# Patient Record
Sex: Female | Born: 1990 | Race: Black or African American | Hispanic: No | Marital: Single | Smoking: Current every day smoker
Health system: Southern US, Community
[De-identification: ages and names within clinical notes are randomized; demographics above are authoritative.]

## PROBLEM LIST (undated history)

## (undated) DIAGNOSIS — I10 Essential (primary) hypertension: Secondary | ICD-10-CM

---
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
## 2010-04-16 LAB — URINALYSIS, ROUTINE W REFLEX MICROSCOPIC
Glucose, UA: NEGATIVE mg/dL
Ketones, ur: 40 mg/dL — AB
Protein, ur: NEGATIVE mg/dL
Urobilinogen, UA: 1 mg/dL (ref 0.0–1.0)

## 2010-04-16 LAB — URINE CULTURE: Colony Count: >=100000

## 2010-04-16 LAB — URINE MICROSCOPIC-ADD ON

## 2013-07-08 ENCOUNTER — Emergency Department (HOSPITAL_COMMUNITY): Payer: Self-pay

## 2013-07-08 ENCOUNTER — Encounter (HOSPITAL_COMMUNITY): Payer: Self-pay | Admitting: Emergency Medicine

## 2013-07-08 ENCOUNTER — Emergency Department (HOSPITAL_COMMUNITY)
Admission: EM | Admit: 2013-07-08 | Discharge: 2013-07-09 | Disposition: A | Discharge status: Home / Self Care | Payer: Self-pay | Attending: Emergency Medicine | Admitting: Emergency Medicine

## 2013-07-08 DIAGNOSIS — N730 Acute parametritis and pelvic cellulitis: Secondary | ICD-10-CM | POA: Insufficient documentation

## 2013-07-08 DIAGNOSIS — F17200 Nicotine dependence, unspecified, uncomplicated: Secondary | ICD-10-CM | POA: Insufficient documentation

## 2013-07-08 DIAGNOSIS — I10 Essential (primary) hypertension: Secondary | ICD-10-CM | POA: Insufficient documentation

## 2013-07-08 DIAGNOSIS — Z3202 Encounter for pregnancy test, result negative: Secondary | ICD-10-CM | POA: Insufficient documentation

## 2013-07-08 HISTORY — DX: Essential (primary) hypertension: I10

## 2013-07-08 LAB — URINALYSIS, ROUTINE W REFLEX MICROSCOPIC
Bilirubin Urine: NEGATIVE
Glucose, UA: NEGATIVE mg/dL
Hgb urine dipstick: NEGATIVE
Ketones, ur: NEGATIVE mg/dL
NITRITE: NEGATIVE
PROTEIN: NEGATIVE mg/dL
Specific Gravity, Urine: 1.023 (ref 1.005–1.030)
UROBILINOGEN UA: 1 mg/dL (ref 0.0–1.0)
pH: 6 (ref 5.0–8.0)

## 2013-07-08 LAB — POC URINE PREG, ED: Preg Test, Ur: NEGATIVE

## 2013-07-08 LAB — WET PREP, GENITAL
Clue Cells Wet Prep HPF POC: NONE SEEN
Trich, Wet Prep: NONE SEEN
Yeast Wet Prep HPF POC: NONE SEEN

## 2013-07-08 LAB — URINE MICROSCOPIC-ADD ON

## 2013-07-08 MED ORDER — CEFTRIAXONE SODIUM 1 G IJ SOLR
1.0000 g | Freq: Once | INTRAMUSCULAR | Status: AC
  Administered 2013-07-09: 1 g via INTRAMUSCULAR
  Filled 2013-07-08: qty 10

## 2013-07-08 MED ORDER — DOXYCYCLINE HYCLATE 100 MG PO CAPS: 100.0000 mg | ORAL_CAPSULE | Freq: Two times a day (BID) | ORAL | Status: DC

## 2013-07-08 MED ORDER — CEFTRIAXONE SODIUM 250 MG IJ SOLR: 250.0000 mg | Freq: Once | INTRAMUSCULAR | Status: DC

## 2013-07-08 MED ORDER — OXYCODONE-ACETAMINOPHEN 5-325 MG PO TABS
1.0000 | ORAL_TABLET | Freq: Once | ORAL | Status: AC
  Administered 2013-07-08: 1 via ORAL
  Filled 2013-07-08: qty 1

## 2013-07-08 MED ORDER — OXYCODONE-ACETAMINOPHEN 5-325 MG PO TABS: 1.0000 | ORAL_TABLET | ORAL | Status: DC | PRN

## 2013-07-08 MED ORDER — METRONIDAZOLE 500 MG PO TABS
2000.0000 mg | ORAL_TABLET | Freq: Once | ORAL | Status: AC
  Administered 2013-07-09: 2000 mg via ORAL
  Filled 2013-07-08: qty 4

## 2013-07-08 MED ORDER — AZITHROMYCIN 1 G PO PACK
1.0000 g | PACK | Freq: Once | ORAL | Status: AC
  Administered 2013-07-09: 1 g via ORAL
  Filled 2013-07-08: qty 1

## 2013-07-08 NOTE — ED Notes (Signed)
Radiology contacted [place], state [facility] should be here to see pt in 15-20 min.
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
## 2013-07-08 NOTE — ED Notes (Signed)
Per pt report: pt was recently contacted by the health dept and was told she was positive for chlamydia.  Pt is also having sever lower abd pain. Pt a/o x 4. Skin warm and dry. Pt ambulatory.

## 2013-07-08 NOTE — ED Provider Notes (Signed)
Care assumed from Bruce Donathony Allen, MD.    Titus DubinKortney Chenae Raj JanusRhone is a 23 y.o. female presents with vaginal discharge, lower abd cramping.  Pt reports she was contacted by the health department and told that she was positive for chlamydia.  Pt without N/V.  Wet prep with moderate WBCs.  Concern for PID.  Plan: Pt treated in the department with Flagyl, rocephin and azithromycin.  US pelvis pending.  Pt is to be d/c home with doxycycline.    Face to face Exam:   General: Awake  HEENT: Atraumatic  Resp: Normal effort  Abd: Nondistended, mild lower abdominal tenderness, no rebound  Neuro:No focal weakness  Lymph: No adenopathy  BP 137/89  Pulse 55  Temp(Src) 98 F (36.7 C)  Resp 20  SpO2 100%  LMP 06/29/2013    12:50 AM  Results for orders placed during the hospital encounter of 07/08/13  WET PREP, GENITAL      Result Value Ref Range   Yeast Wet Prep HPF POC NONE SEEN  NONE SEEN   Trich, Wet Prep NONE SEEN  NONE SEEN   Clue Cells Wet Prep HPF POC NONE SEEN  NONE SEEN   WBC, Wet Prep HPF POC MODERATE (*) NONE SEEN  URINALYSIS, ROUTINE W REFLEX MICROSCOPIC      Result Value Ref Range   Color, Urine YELLOW  YELLOW   APPearance CLEAR  CLEAR   Specific Gravity, Urine 1.023  1.005 - 1.030   pH 6.0  5.0 - 8.0   Glucose, UA NEGATIVE  NEGATIVE mg/dL   Hgb urine dipstick NEGATIVE  NEGATIVE   Bilirubin Urine NEGATIVE  NEGATIVE   Ketones, ur NEGATIVE  NEGATIVE mg/dL   Protein, ur NEGATIVE  NEGATIVE mg/dL   Urobilinogen, UA 1.0  0.0 - 1.0 mg/dL   Nitrite NEGATIVE  NEGATIVE   Leukocytes, UA SMALL (*) NEGATIVE  URINE MICROSCOPIC-ADD ON      Result Value Ref Range   Squamous Epithelial / LPF RARE  RARE   WBC, UA 0-2  <3 WBC/hpf  POC URINE PREG, ED      Result Value Ref Range   Preg Test, Ur NEGATIVE  NEGATIVE   [place] Transvaginal Non-ob  07/09/2013   CLINICAL DATA:  Pelvic pain. LMP 2 weeks ago. History of chlamydia.  EXAM: TRANSABDOMINAL AND TRANSVAGINAL ULTRASOUND OF PELVIS  DOPPLER  ULTRASOUND OF OVARIES  TECHNIQUE: Both transabdominal and transvaginal ultrasound examinations of the pelvis were performed. Transabdominal technique was performed for global imaging of the pelvis including uterus, ovaries, adnexal regions, and pelvic cul-de-sac.  It was necessary to proceed with endovaginal exam following the transabdominal exam to visualize the uterus and ovaries. Color and duplex Doppler ultrasound was utilized to evaluate blood flow to the ovaries.  COMPARISON:  None.  FINDINGS: Uterus  Measurements: 5.1 x 2.9 x 3.8 cm, anteverted. No fibroids or other mass visualized.  Endometrium  Thickness: 8 mm.  No focal abnormality visualized.  Right ovary  Measurements: 3.4 x 1.9 x 3.3 cm. Normal appearance/no adnexal mass.  Left ovary  Measurements: 2.8 x 1.7 x 1.8 cm . Normal appearance/no adnexal mass.  Pulsed Doppler evaluation of both ovaries demonstrates normal low-resistance arterial and venous waveforms. Flow is demonstrated within both ovaries on color flow Doppler imaging.  Other findings  No free fluid.  IMPRESSION: Normal ultrasound appearance of the uterus and ovaries. Normal Doppler flow and waveforms demonstrated in the ovaries.   Electronically Signed   By: Burman NievesWilliam  Stevens M.D.   On:  07/09/2013 00:06   US Pelvis Complete  07/09/2013   CLINICAL DATA:  Pelvic pain. LMP 2 weeks ago. History of chlamydia.  EXAM: TRANSABDOMINAL AND TRANSVAGINAL ULTRASOUND OF PELVIS  DOPPLER ULTRASOUND OF OVARIES  TECHNIQUE: Both transabdominal and transvaginal ultrasound examinations of the pelvis were performed. Transabdominal technique was performed for global imaging of the pelvis including uterus, ovaries, adnexal regions, and pelvic cul-de-sac.  It was necessary to proceed with endovaginal exam following the transabdominal exam to visualize the uterus and ovaries. Color and duplex Doppler ultrasound was utilized to evaluate blood flow to the ovaries.  COMPARISON:  None.  FINDINGS: Uterus  Measurements:  5.1 x 2.9 x 3.8 cm, anteverted. No fibroids or other mass visualized.  Endometrium  Thickness: 8 mm.  No focal abnormality visualized.  Right ovary  Measurements: 3.4 x 1.9 x 3.3 cm. Normal appearance/no adnexal mass.  Left ovary  Measurements: 2.8 x 1.7 x 1.8 cm . Normal appearance/no adnexal mass.  Pulsed Doppler evaluation of both ovaries demonstrates normal low-resistance arterial and venous waveforms. Flow is demonstrated within both ovaries on color flow Doppler imaging.  Other findings  No free fluid.  IMPRESSION: Normal ultrasound appearance of the uterus and ovaries. Normal Doppler flow and waveforms demonstrated in the ovaries.   Electronically Signed   By: Burman Nieves M.D.   On: 07/09/2013 00:06   [place] Art/ven Flow Abd Pelv Doppler  07/09/2013   CLINICAL DATA:  Pelvic pain. LMP 2 weeks ago. History of chlamydia.  EXAM: TRANSABDOMINAL AND TRANSVAGINAL ULTRASOUND OF PELVIS  DOPPLER ULTRASOUND OF OVARIES  TECHNIQUE: Both transabdominal and transvaginal ultrasound examinations of the pelvis were performed. Transabdominal technique was performed for global imaging of the pelvis including uterus, ovaries, adnexal regions, and pelvic cul-de-sac.  It was necessary to proceed with endovaginal exam following the transabdominal exam to visualize the uterus and ovaries. Color and duplex Doppler ultrasound was utilized to evaluate blood flow to the ovaries.  COMPARISON:  None.  FINDINGS: Uterus  Measurements: 5.1 x 2.9 x 3.8 cm, anteverted. No fibroids or other mass visualized.  Endometrium  Thickness: 8 mm.  No focal abnormality visualized.  Right ovary  Measurements: 3.4 x 1.9 x 3.3 cm. Normal appearance/no adnexal mass.  Left ovary  Measurements: 2.8 x 1.7 x 1.8 cm . Normal appearance/no adnexal mass.  Pulsed Doppler evaluation of both ovaries demonstrates normal low-resistance arterial and venous waveforms. Flow is demonstrated within both ovaries on color flow Doppler imaging.  Other findings  No free fluid.   IMPRESSION: Normal ultrasound appearance of the uterus and ovaries. Normal Doppler flow and waveforms demonstrated in the ovaries.   Electronically Signed   By: Burman Nieves M.D.   On: 07/09/2013 00:06    Ultrasound without evidence of tubo-ovarian abscess and normal Doppler flow.  This is been treated for PID here in the emergency department. Her vitals are stable she is nontoxic and nonseptic appearing. She'll be discharged home with doxycycline and Percocet.  I discussed all these findings with her. She's been informed to followup with OB/GYN within one week for further evaluation. She's to return to the emergency department for high fevers, intractable vomiting or worsening abdominal pain.  It has been determined that no acute conditions requiring further emergency intervention are present at this time. The patient/guardian have been advised of the diagnosis and plan. We have discussed signs and symptoms that warrant return to the ED, such as changes or worsening in symptoms.   Vital signs are stable at discharge.  BP 137/89  Pulse 55  Temp(Src) 98 F (36.7 C)  Resp 20  SpO2 100%  LMP 06/29/2013  Patient/guardian has voiced understanding and agreed to follow-up with the PCP or specialist.      Dierdre Forth, PA-C 07/09/13 [phone]

## 2013-07-08 NOTE — ED Notes (Signed)
[place] called, states they are at [facility] and will come see pt when they are done.

## 2013-07-09 LAB — RPR

## 2013-07-09 LAB — HIV ANTIBODY (ROUTINE TESTING W REFLEX): HIV 1&2 Ab, 4th Generation: NONREACTIVE

## 2013-07-09 LAB — GC/CHLAMYDIA PROBE AMP
CT PROBE, AMP APTIMA: POSITIVE — AB
GC Probe RNA: NEGATIVE

## 2013-07-09 NOTE — Discharge Instructions (Signed)
Pelvic Inflammatory Disease °Pelvic inflammatory disease (PID) refers to an infection in some or all of the female organs. The infection can be in the uterus, ovaries, fallopian tubes, or the surrounding tissues in the pelvis. PID can cause abdominal or pelvic pain that comes on suddenly (acute pelvic pain). PID is a serious infection because it can lead to lasting (chronic) pelvic pain or the inability to have children (infertile).  °CAUSES  °The infection is often caused by the normal bacteria found in the vaginal tissues. PID may also be caused by an infection that is spread during sexual contact. PID can also occur following:  °· The birth of a baby.   °· A miscarriage.   °· An abortion.   °· Major pelvic surgery.   °· The use of an intrauterine device (IUD).   °· A sexual assault.   °RISK FACTORS °Certain factors can put a person at higher risk for PID, such as: °· Being younger than 25 years. °· Being sexually active at a young age. °· Using nonbarrier contraception. °· Having multiple sexual partners. °· Having sex with someone who has symptoms of a genital infection. °· Using oral contraception. °Other times, certain behaviors can increase the possibility of getting PID, such as: °· Having sex during your period. °· Using a vaginal douche. °· Having an intrauterine device (IUD) in place. °SYMPTOMS  °· Abdominal or pelvic pain.   °· Fever.   °· Chills.   °· Abnormal vaginal discharge. °· Abnormal uterine bleeding.   °· Unusual pain shortly after finishing your period. °DIAGNOSIS  °Your caregiver will choose some of the following methods to make a diagnosis, such as:  °· Performing a physical exam and history. A pelvic exam typically reveals a very tender uterus and surrounding pelvis.   °· Ordering laboratory tests including a pregnancy test, blood tests, and urine test.  °· Ordering cultures of the vagina and cervix to check for a sexually transmitted infection (STI). °· Performing an ultrasound.    °· Performing a laparoscopic procedure to look inside the pelvis.   °TREATMENT  °· Antibiotic medicines may be prescribed and taken by mouth.   °· Sexual partners may be treated when the infection is caused by a sexually transmitted disease (STD).   °· Hospitalization may be needed to give antibiotics intravenously. °· Surgery may be needed, but this is rare. °It may take weeks until you are completely well. If you are diagnosed with PID, you should also be checked for human immunodeficiency virus (HIV).   °HOME CARE INSTRUCTIONS  °· If given, take your antibiotics as directed. Finish the medicine even if you start to feel better.   °· Only take over-the-counter or prescription medicines for pain, discomfort, or fever as directed by your caregiver.   °· Do not have sexual intercourse until treatment is completed or as directed by your caregiver. If PID is confirmed, your recent sexual partner(s) will need treatment.   °· Keep your follow-up appointments. °SEEK MEDICAL CARE IF:  °· You have increased or abnormal vaginal discharge.   °· You need prescription medicine for your pain.   °· You vomit.   °· You cannot take your medicines.   °· Your partner has an STD.   °SEEK IMMEDIATE MEDICAL CARE IF:  °· You have a fever.   °· You have increased abdominal or pelvic pain.   °· You have chills.   °· You have pain when you urinate.   °· You are not better after 72 hours following treatment.   °MAKE SURE YOU:  °· Understand these instructions. °· Will watch your condition. °· Will get help right away if you are not doing well or get worse. °  Document Released: 01/18/2005 Document Revised: 05/15/2012 Document Reviewed: 01/14/2011 [facility] Patient Information 2014 [place], [place].    Emergency Department Resource Guide 1) Find a Doctor and Pay Out of Pocket Although you won't have to find out who is covered by your insurance plan, it is a good idea to ask around and get recommendations. You will then need to call the  office and see if the doctor you have chosen will accept you as a new patient and what types of options they offer for patients who are self-pay. Some doctors offer discounts or will set up payment plans for their patients who do not have insurance, but you will need to ask so you aren't surprised when you get to your appointment.  2) Contact Your Local Health Department Not all health departments have doctors that can see patients for sick visits, but many do, so it is worth a call to see if yours does. If you don't know where your local health department is, you can check in your phone book. The CDC also has a tool to help you locate your state's health department, and many state websites also have listings of all of their local health departments.  3) Find a Walk-in Clinic If your illness is not likely to be very severe or complicated, you may want to try a walk in clinic. These are popping up all over the country in pharmacies, drugstores, and shopping centers. They're usually staffed by nurse practitioners or physician assistants that have been trained to treat common illnesses and complaints. They're usually fairly quick and inexpensive. However, if you have serious medical issues or chronic medical problems, these are probably not your best option.  No Primary Care Doctor: - Call Health Connect at  [phone] - they can help you locate a primary care doctor that  accepts your insurance, provides certain services, etc. - Physician Referral Service- [phone]  Chronic Pain Problems: Organization         Address  Phone   Notes  [facility]  [phone] Patients need to be referred by their primary care doctor.   Medication Assistance: Organization         Address  Phone   Notes  [facility] [address] [place], [place] [address] [phone]) [phone] --Must be a resident of [place] -- Must have NO insurance coverage  whatsoever (no Medicaid/ Medicare, etc.) -- The pt. MUST have a primary care doctor that directs their care regularly and follows them in the community   [facility]  [phone]   [employer]  [phone]    Agencies that provide inexpensive medical care: Organization         Address  Phone   Notes  [facility]  [phone]   [facility]    [phone]   [facility] [address] [place], [place] [address] [phone]   [facility] [address], [place] [phone]) [phone]   [facility]    [phone]   [facility]    [phone]   [facility]  [address], [place] Phone:  [phone]) [phone], Fax:  [phone] Hours of Operation:  9 am - 6 pm, M-F.  Also accepts Medicaid/Medicare and self-pay.  [facility]  [address], [place] Phone: [phone], Fax: [phone]. Hours of  8:30 am - 5:30 pm, M-F.  Also accepts Medicaid and self-pay.  °[facility] [place] [address], [place] Phone: [phone]   °[facility] [address], [place], [place] [phone], Ext. [phone] Mondays & Thursdays: 7-9 AM.  First 15 patients are seen on a first come, first serve basis. °  ° °Medicaid-accepting [place] Providers: ° °Organization         Address  Phone   Notes  °[facility] [address], [place] [phone] Also accepts self-pay patients.  °[facility] [address], [place] ° [phone]   °[facility] [address], [place] [phone]   °[facility] [address] [place] [address], [place] [phone]   °Veita Bland [address], [place]  ° [phone] Only accepts [place] Access Medicaid patients after they have their name applied  to their card.  ° °Self-Pay (no insurance) in [place]: ° °Organization         Address  Phone   Notes  °Sickle Cell Patients, [facility] [address], [place] [phone]   °[facility] [address], [place] [phone]   °[facility] ° [address], [place] [phone]   °[facility]/Dr. Osei-Bonsu ° [address] [place] [address], [place] or [address], [place] [phone] Phone number for both [place] and [place] locations is the same.  °[facility] [address], [place] [phone]   °[facility] [address] [place] [address], [place] or [address] [phone] °([phone]   °[facility] [address], [place] [phone], phone; [phone], fax Sees patients 1st and 3rd Saturday of every month.  Must not qualify for public or private insurance (i.e. Medicaid, Medicare, [place] Health Choice, Veterans' Benefits) • Household income should be no more than 200% of the poverty level •The clinic cannot treat you if you are pregnant or think you are pregnant • Sexually transmitted diseases are not treated at the clinic.  ° ° °Dental Care: °Organization         Address  Phone  Notes  °[place] [facility] [address], [place] [phone]) [phone] Accepts children up to age 21 who are enrolled in Medicaid or [place] Health Choice; pregnant women with a Medicaid card; and children who have applied for Medicaid or [place] Health Choice, but were declined, whose parents can pay a reduced fee at time of service.  °[place] [facility] [place]  [address], [place] [phone]) [phone] Accepts children up to age 21 who are enrolled in Medicaid or [place] Health Choice; pregnant women with a Medicaid card; and children who have applied for Medicaid or   Health Choice, but were declined, whose parents can pay a reduced fee at time of service.  °[facility] ° [address], [place] [phone]) [phone] Patients are seen by appointment only. Walk-ins are not accepted. [facility] will see patients 18 years of age and older. °Monday - Tuesday (8am-5pm) °Most Wednesdays (8:30-5pm) °$30 per visit, cash only  °[facility] ° [address], [place] [phone] Patients are seen by appointment   by appointment only. Walk-ins are not accepted. [facility] will see patients 53 years of age and older. One Wednesday Evening (Monthly: Volunteer Based).  $30 per visit, cash only  [employer]  [phone] for adults; Children under age 66, call Graduate Pediatric Dentistry at [phone]. Children aged 49-14, please call [phone] to request a pediatric application.  Dental services are provided in all areas of dental care including fillings, crowns and bridges, complete and partial dentures, implants, gum treatment, root canals, and extractions. Preventive care is also provided. Treatment is provided to both adults and children. Patients are selected via a lottery and there is often a waiting list.   [facility] [address] [place]  [phone] [email or web address]   [facility] [address] [place], [place] [phone], Ext. [phone] Second and Fourth Thursday of each month, opens at 6:30 AM; Clinic ends at 9 AM.  Patients are seen on a first-come first-served basis, and a limited number are seen during each clinic.   [facility]  [address] [place], [place] [phone]   Eligibility Requirements You must have lived in [place], [place], or [place] counties for at least the last three months.   You cannot be eligible for state or federal sponsored [place], including [employer], [place], or [employer].    You generally cannot be eligible for healthcare insurance through your employer.    How to apply: Eligibility screenings are held every Tuesday and Wednesday afternoon from 1:00 pm until 4:00 pm. You do not need an appointment for the interview!  [facility] [address], [place], [place] [phone]   [facility] Health Department  [phone]   [facility] Health Department  [phone]   [facility] Health Department  [phone]    Behavioral Health Resources in the Community: Intensive Outpatient Programs Organization         Address  Phone  Notes  [facility] [address], [place], [place] [phone]   [facility] Outpatient [address] [place], [place] [phone]   [facility] [address] [place], [place]  [phone]   [facility] [address]  [place], [place] [phone] or [phone]   Substance Abuse Resources Organization         Address  Phone  Notes  [facility]  [phone]   [facility]  ([phone]   [facility] [place]  [phone]   Floydene Flock  [phone]   Residential & Outpatient Substance Abuse Program  [phone]   Psychological Services Organization         Address  Phone  Notes  [facility] Behavioral Health  [phone]   [facility] Services  [phone]   [facility] [address], [place] [phone] or [phone]    Mobile Crisis Teams Organization         Address  Phone  Notes  [facility], Mobile Crisis Care Unit  7264258222   [facility]  [address] [place], [place] [phone]   Doristine Locks [address] [place] [phone]    Self-Help/Support Groups Organization         Address  Phone             Notes  [facility] [place] - variety of support groups  336- I7437963 Call  for more  information  Narcotics Anonymous (NA), [facility] [address], [employer] [place]  2 meetings at this location   Residential Sports administrator         Address  Phone  Notes  [facility] [address],    [place]  [phone]   [facility]  [address], [place] [address], [place], [place] [phone]   [facility] [address] [place], [place] [phone] Admissions: 8am-3pm M-F  [facility] [address],    [place], [place] [phone]   [facility] [address] [place], [place], [place] [phone]   [facility] [address]  [place], [place] [phone]   [facility] - Intensive Outpatient [address], [place], [place] [phone]   [facility] ([facility] [address]  [place], [place] [phone] or [phone]   [facility] ([facility]) [address], [place], [place] [phone] Accepts Medicaid  [facility] [place] [address]  [place] [phone] Substance Abuse/Addiction Treatment   [facility] Organization         Address  Phone  Notes  [facility]  [phone]   Angie Fava, PhD [address] [place], [place]   [phone] or [phone]   [facility]   [address] [place], [place] [phone]   [facility] [address], [place], [place] [phone] Insurance/Medicaid/sponsorship through [facility] and Families [address]                                    [place], [place] [phone] Therapy/tele-psych/case  [facility] [address] [place], [place] [phone]    Dr. Lolly Mustache  [phone]   [facility] [place]  [employer] [facility] Dept. 1) [address], [place] 2) [address] [place] 3)  [address], [place]  [phone]  [phone]   [facility] Child Abuse Hotline 717-566-3315 or [phone] (After Hours)

## 2013-07-09 NOTE — ED Provider Notes (Signed)
Medical screening examination/treatment/procedure(s) were performed by non-physician practitioner and as supervising physician I was immediately available for consultation/collaboration.  Yoshua Geisinger T Shylyn Younce, MD 07/09/13 0957 

## 2013-07-10 ENCOUNTER — Telehealth (HOSPITAL_BASED_OUTPATIENT_CLINIC_OR_DEPARTMENT_OTHER): Payer: Self-pay | Admitting: Emergency Medicine

## 2013-07-19 NOTE — Telephone Encounter (Signed)
Unable to contact patient via phone. Sent letter. °

## 2013-07-31 NOTE — Telephone Encounter (Signed)
Letter returned. Unable to contact patient. 

## 2014-12-31 IMAGING — US US PELVIS COMPLETE
1 series · 13 of 25 positions shown · non-contrast
Comparison: None.

CLINICAL DATA: Pelvic pain. LMP 2 weeks ago. History of chlamydia.

EXAM:
TRANSABDOMINAL AND TRANSVAGINAL ULTRASOUND OF PELVIS
DOPPLER ULTRASOUND OF OVARIES
TECHNIQUE: Both transabdominal and transvaginal ultrasound examinations of the
pelvis were performed. Transabdominal technique was performed for
global imaging of the pelvis including uterus, ovaries, adnexal
regions, and pelvic cul-de-sac.
It was necessary to proceed with endovaginal exam following the
transabdominal exam to visualize the uterus and ovaries. Color and
duplex Doppler ultrasound was utilized to evaluate blood flow to the
ovaries.

[Series 1: us pelvis complete · 0.20mm/px · 13 of 51 slices shown]
[im 1/51]
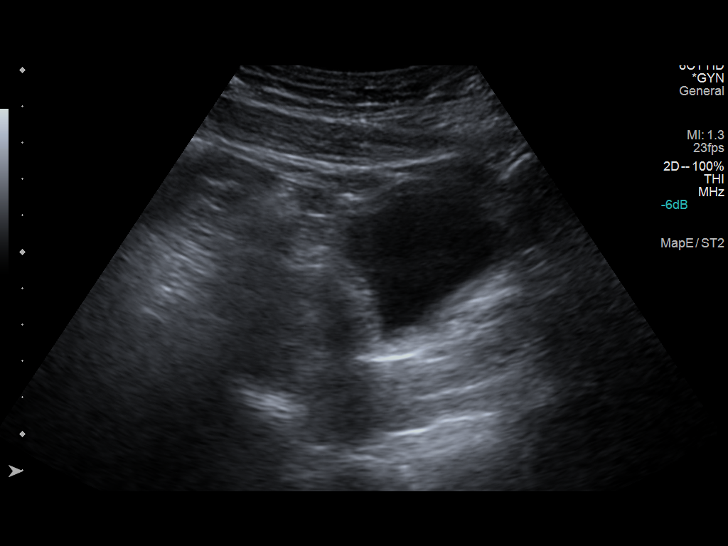
[im 5/51]
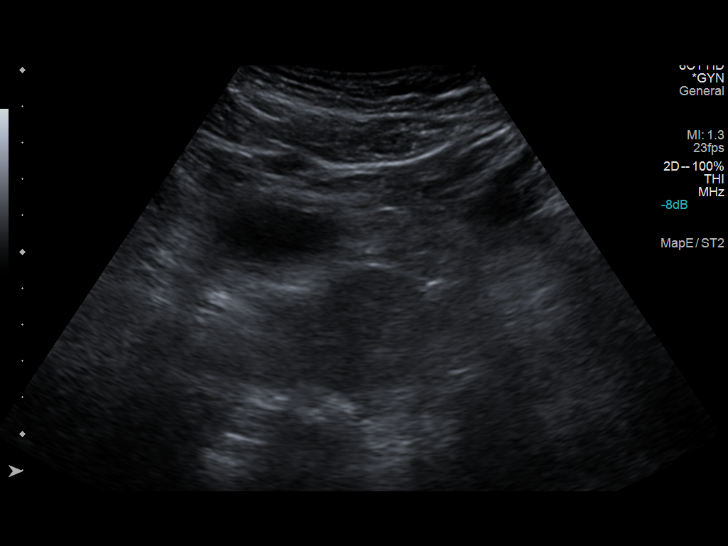
[im 9/51]
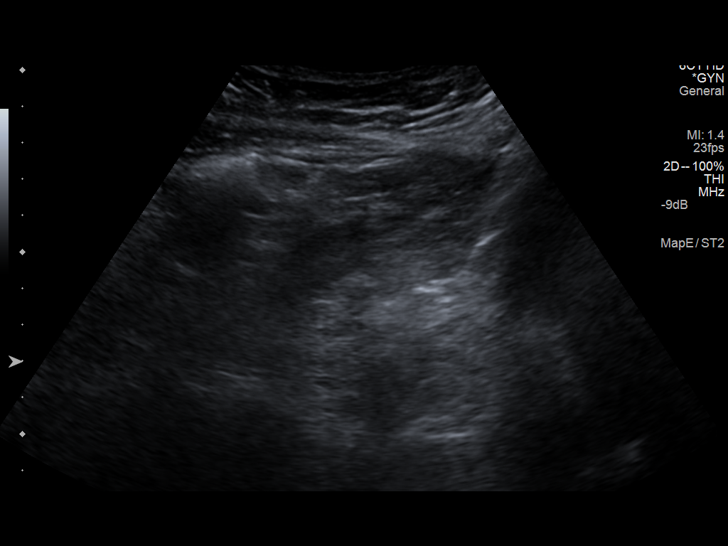
[im 13/51]
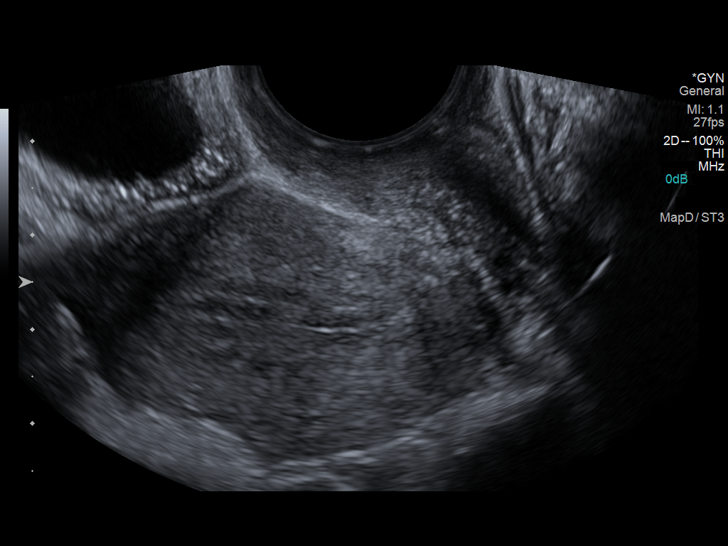
[im 17/51]
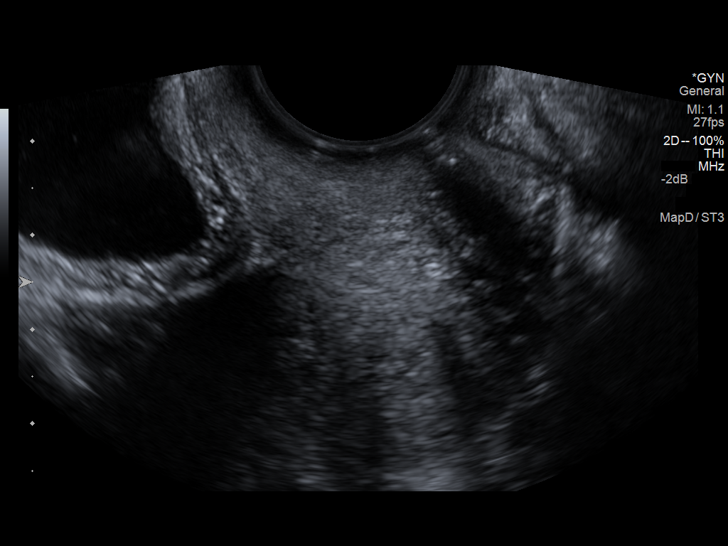
[im 21/51]
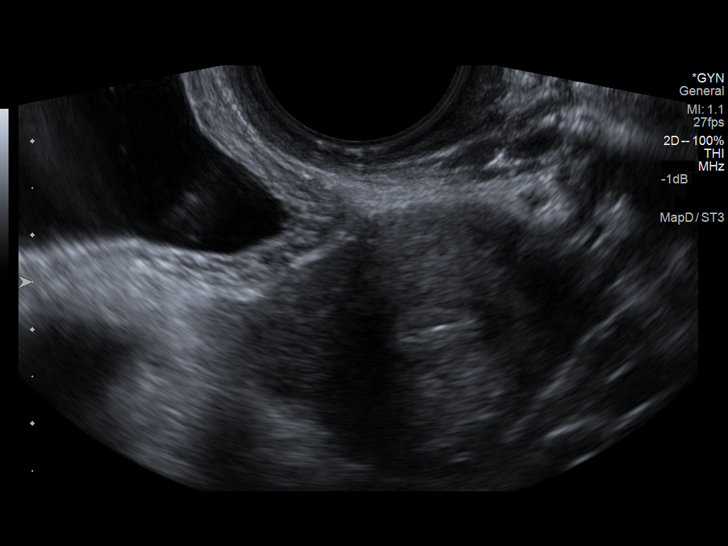
[im 26/51]
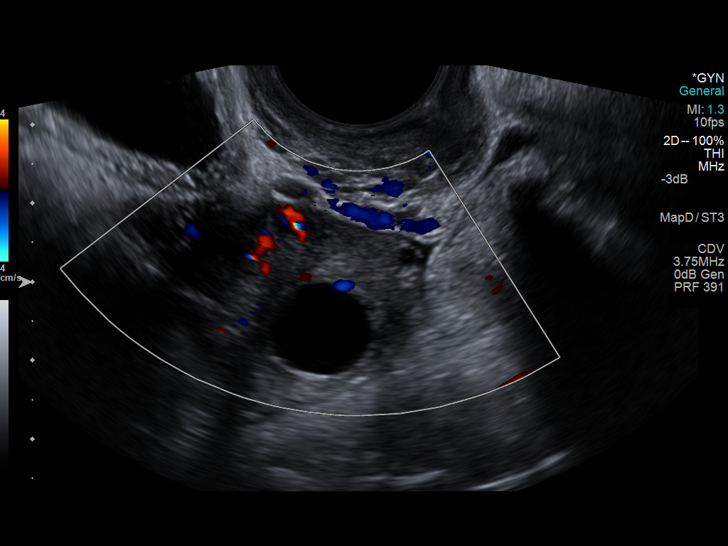
[im 30/51]
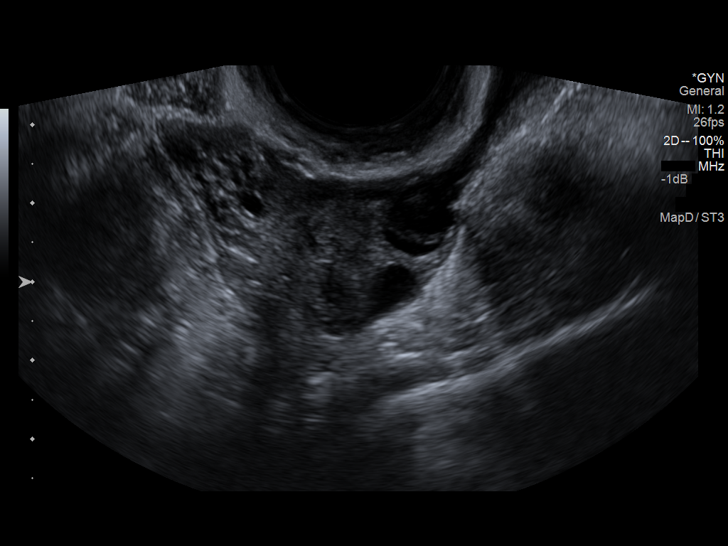
[im 34/51]
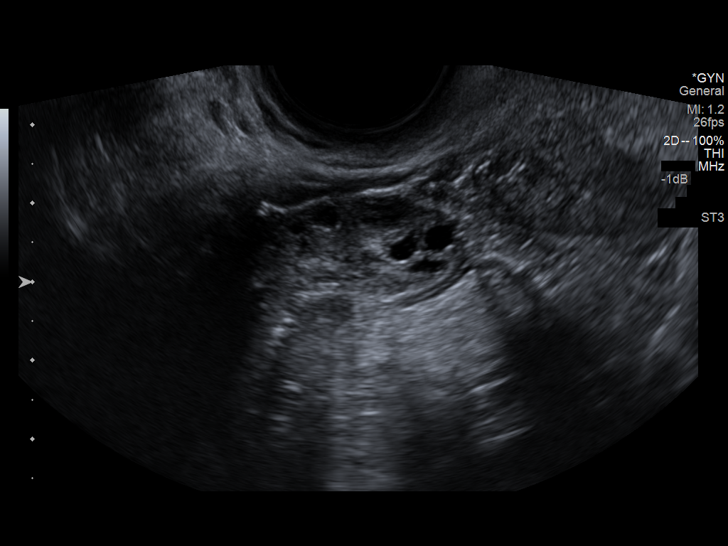
[im 38/51]
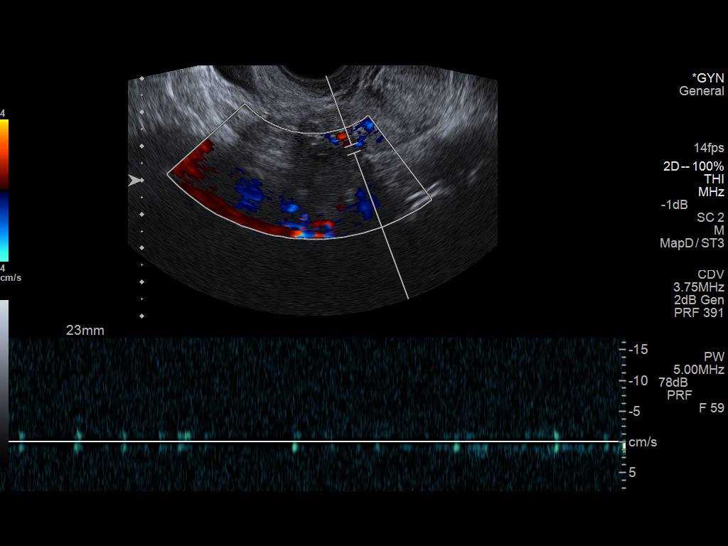
[im 42/51]
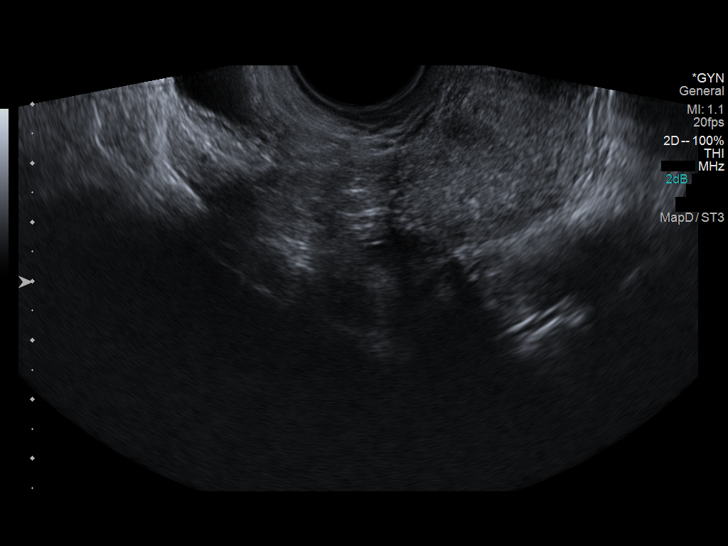
[im 46/51]
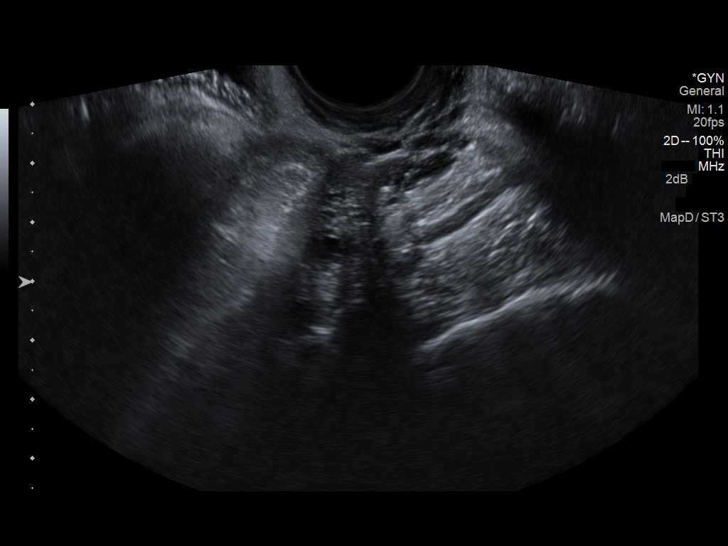
[im 51/51]
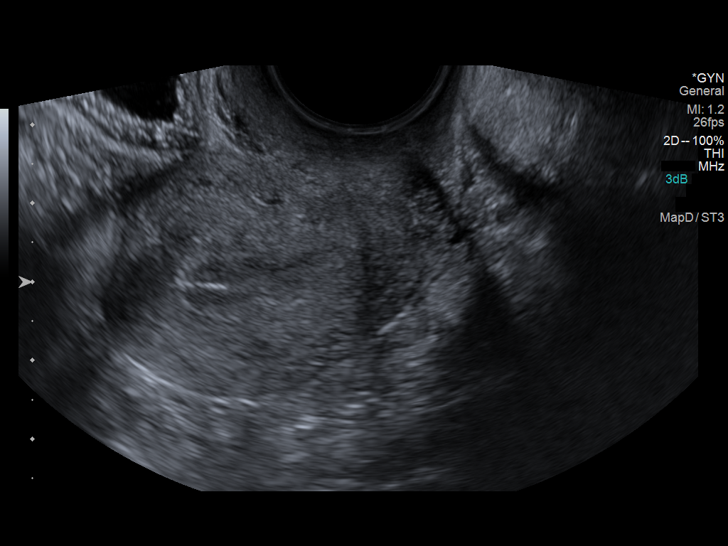

[13 of 25 positions shown; findings below may reference images not displayed]

FINDINGS: Uterus

Measurements: 5.1 x 2.9 x 3.8 cm, anteverted. No fibroids or other
mass visualized.

Endometrium

Thickness: 8 mm.  No focal abnormality visualized.

Right ovary

Measurements: 3.4 x 1.9 x 3.3 cm. Normal appearance/no adnexal mass.

Left ovary

Measurements: 2.8 x 1.7 x 1.8 cm . Normal appearance/no adnexal
mass.

Pulsed Doppler evaluation of both ovaries demonstrates normal
low-resistance arterial and venous waveforms. Flow is demonstrated
within both ovaries on color flow Doppler imaging.

Other findings

No free fluid.
IMPRESSION: Normal ultrasound appearance of the uterus and ovaries. Normal
Doppler flow and waveforms demonstrated in the ovaries.

## 2015-10-17 ENCOUNTER — Ambulatory Visit (HOSPITAL_COMMUNITY)
Admission: EM | Admit: 2015-10-17 | Discharge: 2015-10-17 | Disposition: A | Discharge status: Home / Self Care | Payer: Managed Care, Other (non HMO) | Attending: Family Medicine | Admitting: Family Medicine

## 2015-10-17 ENCOUNTER — Encounter (HOSPITAL_COMMUNITY): Payer: Self-pay | Admitting: Nurse Practitioner

## 2015-10-17 DIAGNOSIS — J0100 Acute maxillary sinusitis, unspecified: Secondary | ICD-10-CM | POA: Diagnosis not present

## 2015-10-17 MED ORDER — AMOXICILLIN-POT CLAVULANATE 875-125 MG PO TABS: 1.0000 | ORAL_TABLET | Freq: Two times a day (BID) | ORAL | 0 refills | Status: DC

## 2015-10-17 MED ORDER — ALBUTEROL SULFATE HFA 108 (90 BASE) MCG/ACT IN AERS: 2.0000 | INHALATION_SPRAY | Freq: Four times a day (QID) | RESPIRATORY_TRACT | 0 refills | Status: DC | PRN

## 2015-10-17 NOTE — ED Triage Notes (Signed)
She c/o 3 day history cough, congestion, rhinorrhea. She also c/o vaginal discharge and itching after each menstrual cycle for past 2 years. She has seen her doctor for this but they did not give her any diagnosis or treatment. She has tried monistat with no relief.

## 2015-10-17 NOTE — Discharge Instructions (Signed)
Start Augmentin twice a day as directed. Use Albuterol inhaler 2 puffs every 6 hours as needed for cough. Increase fluid intake and slowly advance diet as directed. Follow-up with a primary care provider in 3 to 4 days if not improving.

## 2015-10-17 NOTE — ED Provider Notes (Signed)
CSN: [id number]     Arrival date & time 10/17/15  1534 History   First MD Initiated Contact with Patient 10/17/15 1739     Chief Complaint  Patient presents with  . URI   (Consider location/radiation/quality/duration/timing/severity/associated sxs/prior Treatment) 25 year old female presents with nasal congestion, sinus pressure, coughing for the past 3-4 days. Started with fever, nausea, vomiting and diarrhea and now more sinus symptoms. Coughed up blood in her mucus which caused her concern. She has not taken any medication for symptoms.  Also complaining of vaginal discharge with odor and itching after each menstrual cycle for the past 2 years. Her GYN has not done any additional testing or provided treatment. She has tried OTC Monistat with minimal relief.    The history is provided by the patient.    Past Medical History:  Diagnosis Date  . Hypertension    History reviewed. No pertinent surgical history. History reviewed. No pertinent family history. Social History  Substance Use Topics  . Smoking status: Current Every Day Smoker    Packs/day: 0.25    Types: Cigarettes  . Smokeless tobacco: Never Used  . Alcohol use Yes     Comment: occasional   OB History    No data available     Review of Systems  Constitutional: Positive for chills, fatigue and fever.  HENT: Positive for congestion and sinus pressure. Negative for ear pain and sore throat.   Eyes: Negative for visual disturbance.  Respiratory: Positive for cough and wheezing. Negative for chest tightness.   Cardiovascular: Negative for chest pain.  Gastrointestinal: Positive for diarrhea, nausea and vomiting. Negative for blood in stool.  Genitourinary: Positive for vaginal discharge. Negative for dysuria.  Skin: Negative for rash.  Neurological: Positive for headaches. Negative for dizziness, weakness and numbness.  Hematological: Negative for adenopathy.    Allergies  Review of patient's allergies indicates  no known allergies.  Home Medications   Prior to Admission medications   Medication Sig Start Date End Date Taking? Authorizing Provider  albuterol (PROVENTIL HFA;VENTOLIN HFA) 108 (90 Base) MCG/ACT inhaler Inhale 2 puffs into the lungs every 6 (six) hours as needed for wheezing or shortness of breath. 10/17/15   Sudie Grumbling, NP  amoxicillin-clavulanate (AUGMENTIN) 875-125 MG tablet Take 1 tablet by mouth every 12 (twelve) hours. 10/17/15   Sudie Grumbling, NP   Meds Ordered and Administered this Visit  Medications - No data to display  BP 148/85 (BP Location: Left Arm)   Pulse 62   Temp 98.5 F (36.9 C) (Oral)   Resp 16   LMP 09/22/2015   SpO2 100%  No data found.   Physical Exam  Constitutional: She is oriented to person, place, and time. She appears well-developed and well-nourished. She appears ill. No distress.  HENT:  Head: Normocephalic and atraumatic.  Right Ear: Hearing, tympanic membrane, external ear and ear canal normal.  Left Ear: Hearing, external ear and ear canal normal. Tympanic membrane is bulging. A middle ear effusion is present.  Nose: Rhinorrhea present. Right sinus exhibits maxillary sinus tenderness. Right sinus exhibits no frontal sinus tenderness. Left sinus exhibits maxillary sinus tenderness. Left sinus exhibits no frontal sinus tenderness.  Mouth/Throat: Uvula is midline and mucous membranes are normal. Posterior oropharyngeal erythema (and post nasal drainage) present.  Neck: Normal range of motion. Neck supple.  Cardiovascular: Normal rate, regular rhythm and normal heart sounds.   Pulmonary/Chest: Effort normal. No respiratory distress. She has decreased breath sounds in the right upper  field, the right lower field, the left upper field and the left lower field. She has no wheezes. She has no rhonchi.  Lymphadenopathy:    She has no cervical adenopathy.  Neurological: She is alert and oriented to person, place, and time.  Skin: Skin is warm and  dry. Capillary refill takes less than 2 seconds.  Psychiatric: She has a normal mood and affect. Her behavior is normal. Judgment and thought content normal.    Urgent Care Course   Clinical Course    Procedures (including critical care time)  Labs Review Labs Reviewed - No data to display  Imaging Review No results found.   Visual Acuity Review  Right Eye Distance:   Left Eye Distance:   Bilateral Distance:    Right Eye Near:   Left Eye Near:    Bilateral Near:         MDM   1. Acute maxillary sinusitis, recurrence not specified   2. Cough  Recommend Augmentin 875mg  twice a day as directed. Use Albuterol inhaler 2 puffs every 6 hours as needed for wheezing and chest tightness. May take OTC Delsym as needed for cough.  Encouraged to stop smoking.  Discussed that since patient is ill today, she should return next week or go to her GYN for further evaluation of vaginal concerns  Follow-up as planned.     Sudie GrumblingAnn Berry Claretha Townshend, NP 10/18/15 [phone]

## 2016-11-09 DIAGNOSIS — N898 Other specified noninflammatory disorders of vagina: Secondary | ICD-10-CM | POA: Diagnosis not present

## 2016-11-09 DIAGNOSIS — Z6841 Body Mass Index (BMI) 40.0 and over, adult: Secondary | ICD-10-CM | POA: Diagnosis not present

## 2016-11-09 DIAGNOSIS — N941 Unspecified dyspareunia: Secondary | ICD-10-CM | POA: Diagnosis not present

## 2016-11-09 DIAGNOSIS — N944 Primary dysmenorrhea: Secondary | ICD-10-CM | POA: Diagnosis not present

## 2016-11-12 DIAGNOSIS — M79671 Pain in right foot: Secondary | ICD-10-CM | POA: Diagnosis not present

## 2016-11-12 DIAGNOSIS — I10 Essential (primary) hypertension: Secondary | ICD-10-CM | POA: Diagnosis not present

## 2016-11-12 DIAGNOSIS — F17210 Nicotine dependence, cigarettes, uncomplicated: Secondary | ICD-10-CM | POA: Diagnosis not present

## 2016-11-12 DIAGNOSIS — M79672 Pain in left foot: Secondary | ICD-10-CM | POA: Diagnosis not present

## 2016-11-19 DIAGNOSIS — Z1159 Encounter for screening for other viral diseases: Secondary | ICD-10-CM | POA: Diagnosis not present

## 2016-11-19 DIAGNOSIS — Z3169 Encounter for other general counseling and advice on procreation: Secondary | ICD-10-CM | POA: Diagnosis not present

## 2016-11-19 DIAGNOSIS — M79672 Pain in left foot: Secondary | ICD-10-CM | POA: Diagnosis not present

## 2016-11-19 DIAGNOSIS — Z114 Encounter for screening for human immunodeficiency virus [HIV]: Secondary | ICD-10-CM | POA: Diagnosis not present

## 2016-11-19 DIAGNOSIS — Z6841 Body Mass Index (BMI) 40.0 and over, adult: Secondary | ICD-10-CM | POA: Diagnosis not present

## 2016-11-19 DIAGNOSIS — I10 Essential (primary) hypertension: Secondary | ICD-10-CM | POA: Diagnosis not present

## 2016-11-19 DIAGNOSIS — Z01419 Encounter for gynecological examination (general) (routine) without abnormal findings: Secondary | ICD-10-CM | POA: Diagnosis not present

## 2016-11-19 DIAGNOSIS — M79671 Pain in right foot: Secondary | ICD-10-CM | POA: Diagnosis not present

## 2016-11-19 DIAGNOSIS — Z118 Encounter for screening for other infectious and parasitic diseases: Secondary | ICD-10-CM | POA: Diagnosis not present

## 2016-11-19 DIAGNOSIS — Z113 Encounter for screening for infections with a predominantly sexual mode of transmission: Secondary | ICD-10-CM | POA: Diagnosis not present

## 2016-12-17 ENCOUNTER — Other Ambulatory Visit: Payer: Self-pay | Admitting: Podiatry

## 2016-12-17 ENCOUNTER — Encounter: Payer: Self-pay | Admitting: Podiatry

## 2016-12-17 ENCOUNTER — Ambulatory Visit (INDEPENDENT_AMBULATORY_CARE_PROVIDER_SITE_OTHER): Payer: BLUE CROSS/BLUE SHIELD

## 2016-12-17 ENCOUNTER — Ambulatory Visit: Payer: BLUE CROSS/BLUE SHIELD | Admitting: Podiatry

## 2016-12-17 VITALS — BP 132/78 | HR 92 | Resp 16

## 2016-12-17 DIAGNOSIS — M659 Synovitis and tenosynovitis, unspecified: Secondary | ICD-10-CM | POA: Diagnosis not present

## 2016-12-17 DIAGNOSIS — M722 Plantar fascial fibromatosis: Secondary | ICD-10-CM

## 2016-12-17 MED ORDER — MELOXICAM 15 MG PO TABS: 15.0000 mg | ORAL_TABLET | Freq: Every day | ORAL | 0 refills | Status: DC

## 2016-12-17 NOTE — Patient Instructions (Addendum)

## 2016-12-17 NOTE — Progress Notes (Signed)
  Subjective:  Patient ID: Pam Campbell, female    DOB: 09/24/1990,  MRN: [id number]  Chief Complaint  Patient presents with  . Foot Pain    Plantar heel bilateral (R>L) - aching and discoloration x 11 months, AM pain, limited shoe gear, unable to exercise, tried vinegar soaks and OTC pain meds-no help    26 y.o. female presents with the above complaint.  Reports pain and aching for 11 months.  Worse in the a.m.  Has tried soaking without relief.  Also concerned about discoloration to the plantar aspect of her feet.  Past Medical History:  Diagnosis Date  . Hypertension    No past surgical history on file.  Current Outpatient Medications:  .  hydrochlorothiazide (HYDRODIURIL) 25 MG tablet, Take 25 mg daily by mouth., Disp: , Rfl:   No Known Allergies Review of Systems  All other systems reviewed and are negative.  Objective:   Vitals:   12/17/16 0814  BP: 132/78  Pulse: 92  Resp: 16   Vitals:   12/17/16 0814  BP: 132/78  Pulse: 92  Resp: 16   General AA&O x3. Normal mood and affect.  Vascular Dorsalis pedis and posterior tibial pulses  present 2+ bilaterally  Capillary refill normal to all digits. Pedal hair growth normal.  Neurologic Epicritic sensation grossly present bilaterally.  Dermatologic No open lesions. Interspaces clear of maceration. Nails well groomed and normal in appearance.  Orthopedic: MMT 5/5 in dorsiflexion, plantarflexion, inversion, and eversion bilaterally. Tender to palpation at the calcaneal tuber bilaterally. No pain with calcaneal squeeze bilaterally. Ankle ROM diminished range of motion bilaterally. Silfverskiold Test: positive bilaterally.   Radiographs: Taken and reviewed. No acute fractures. No evidence of calcaneal stress fracture. Plantar calcaneal spurring noted.  Assessment & Plan:  Patient was evaluated and treated and all questions answered.  Plantar Fasciitis, bilaterally - XR reviewed as above.  - Educated on icing  and stretching. Instructions given.  - Injection consisting of 1cc 0.5 % Marcaine plain, delivered to the plantar fascia. - Night splint dispensed. Medically necessary for stretching of the plantar fascia. - eRx Meloxicam  Procedure: Injection Tendon/Ligament Location: Bilateral plantar fascia at the glabrous junction; medial approach. Skin Prep: Alcohol. Injectate: 1 cc 0.5% marcaine plain, 1 cc dexamethasone phosphate, 0.5 cc kenalog 10. Disposition: Patient tolerated procedure well. Injection site dressed with a band-aid.  Return in about 3 weeks (around 01/07/2017) for Plantar fasciitis.

## 2016-12-31 DIAGNOSIS — L0231 Cutaneous abscess of buttock: Secondary | ICD-10-CM | POA: Diagnosis not present

## 2017-01-07 ENCOUNTER — Ambulatory Visit: Payer: BLUE CROSS/BLUE SHIELD | Admitting: Podiatry

## 2017-11-30 ENCOUNTER — Ambulatory Visit (INDEPENDENT_AMBULATORY_CARE_PROVIDER_SITE_OTHER): Payer: 59 | Admitting: Family Medicine

## 2017-11-30 ENCOUNTER — Encounter: Payer: Self-pay | Admitting: Family Medicine

## 2017-11-30 VITALS — BP 138/98 | Ht 62.0 in | Wt 215.1 lb

## 2017-11-30 DIAGNOSIS — F332 Major depressive disorder, recurrent severe without psychotic features: Secondary | ICD-10-CM

## 2017-11-30 DIAGNOSIS — F5104 Psychophysiologic insomnia: Secondary | ICD-10-CM

## 2017-11-30 MED ORDER — FLUOXETINE HCL 10 MG PO TABS: ORAL_TABLET | ORAL | 1 refills | Status: DC

## 2017-11-30 MED ORDER — TRAZODONE HCL 50 MG PO TABS: 25.0000 mg | ORAL_TABLET | Freq: Every evening | ORAL | 1 refills | Status: DC | PRN

## 2017-11-30 NOTE — Patient Instructions (Signed)
Major Depressive Disorder, Adult Major depressive disorder (MDD) is a mental health condition. It may also be called clinical depression or unipolar depression. MDD usually causes feelings of sadness, hopelessness, or helplessness. MDD can also cause physical symptoms. It can interfere with work, school, relationships, and other everyday activities. MDD may be mild, moderate, or severe. It may occur once (single episode major depressive disorder) or it may occur multiple times (recurrent major depressive disorder). What are the causes? The exact cause of this condition is not known. MDD is most likely caused by a combination of things, which may include:  Genetic factors. These are traits that are passed along from parent to child.  Individual factors. Your personality, your behavior, and the way you handle your thoughts and feelings may contribute to MDD. This includes personality traits and behaviors learned from others.  Physical factors, such as: ? Differences in the part of your brain that controls emotion. This part of your brain may be different than it is in people who do not have MDD. ? Long-term (chronic) medical or psychiatric illnesses.  Social factors. Traumatic experiences or major life changes may play a role in the development of MDD.  What increases the risk? This condition is more likely to develop in women. The following factors may also make you more likely to develop MDD:  A family history of depression.  Troubled family relationships.  Abnormally low levels of certain brain chemicals.  Traumatic events in childhood, especially abuse or the loss of a parent.  Being under a lot of stress, or long-term stress, especially from upsetting life experiences or losses.  A history of: ? Chronic physical illness. ? Other mental health disorders. ? Substance abuse.  Poor living conditions.  Experiencing social exclusion or discrimination on a regular basis.  What are  the signs or symptoms? The main symptoms of MDD typically include:  Constant depressed or irritable mood.  Loss of interest in things and activities.  MDD symptoms may also include:  Sleeping or eating too much or too little.  Unexplained weight change.  Fatigue or low energy.  Feelings of worthlessness or guilt.  Difficulty thinking clearly or making decisions.  Thoughts of suicide or of harming others.  Physical agitation or weakness.  Isolation.  Severe cases of MDD may also occur with other symptoms, such as:  Delusions or hallucinations, in which you imagine things that are not real (psychotic depression).  Low-level depression that lasts at least a year (chronic depression or persistent depressive disorder).  Extreme sadness and hopelessness (melancholic depression).  Trouble speaking and moving (catatonic depression).  How is this diagnosed? This condition may be diagnosed based on:  Your symptoms.  Your medical history, including your mental health history. This may involve tests to evaluate your mental health. You may be asked questions about your lifestyle, including any drug and alcohol use, and how long you have had symptoms of MDD.  A physical exam.  Blood tests to rule out other conditions.  You must have a depressed mood and at least four other MDD symptoms most of the day, nearly every day in the same 2-week timeframe before your health care provider can confirm a diagnosis of MDD. How is this treated? This condition is usually treated by mental health professionals, such as psychologists, psychiatrists, and clinical social workers. You may need more than one type of treatment. Treatment may include:  Psychotherapy. This is also called talk therapy or counseling. Types of psychotherapy include: ? Cognitive behavioral   therapy (CBT). This type of therapy teaches you to recognize unhealthy feelings, thoughts, and behaviors, and replace them with  positive thoughts and actions. ? Interpersonal therapy (IPT). This helps you to improve the way you relate to and communicate with others. ? Family therapy. This treatment includes members of your family.  Medicine to treat anxiety and depression, or to help you control certain emotions and behaviors.  Lifestyle changes, such as: ? Limiting alcohol and drug use. ? Exercising regularly. ? Getting plenty of sleep. ? Making healthy eating choices. ? Spending more time outdoors.  Treatments involving stimulation of the brain can be used in situations with extremely severe symptoms, or when medicine or other therapies do not work over time. These treatments include electroconvulsive therapy, transcranial magnetic stimulation, and vagal nerve stimulation. Follow these instructions at home: Activity  Return to your normal activities as told by your health care provider.  Exercise regularly and spend time outdoors as told by your health care provider. General instructions  Take over-the-counter and prescription medicines only as told by your health care provider.  Do not drink alcohol. If you drink alcohol, limit your alcohol intake to no more than 1 drink a day for nonpregnant women and 2 drinks a day for men. One drink equals 12 oz of beer, 5 oz of wine, or 1 oz of hard liquor. Alcohol can affect any antidepressant medicines you are taking. Talk to your health care provider about your alcohol use.  Eat a healthy diet and get plenty of sleep.  Find activities that you enjoy doing, and make time to do them.  Consider joining a support group. Your health care provider may be able to recommend a support group.  Keep all follow-up visits as told by your health care provider. This is important. Where to find more information: [employer] on Mental Illness  www.nami.org  [employer] of Mental Health  http://www.maynard.net/  National Suicide Prevention  Lifeline  1-800-273-TALK 773 242 9762). This is free, 24-hour help.  Contact a health care provider if:  Your symptoms get worse.  You develop new symptoms. Get help right away if:  You self-harm.  You have serious thoughts about hurting yourself or others.  You see, hear, taste, smell, or feel things that are not present (hallucinate). This information is not intended to replace advice given to you by your health care provider. Make sure you discuss any questions you have with your health care provider. Document Released: 05/15/2012 Document Revised: 09/25/2015 Document Reviewed: 07/30/2015 Elsevier Interactive Patient Education  2018 [employer]. Fluoxetine capsules or tablets (Depression/Mood Disorders) What is this medicine? FLUOXETINE (floo OX e teen) belongs to a class of drugs known as selective serotonin reuptake inhibitors (SSRIs). It helps to treat mood problems such as depression, obsessive compulsive disorder, and panic attacks. It can also treat certain eating disorders. This medicine may be used for other purposes; ask your health care provider or pharmacist if you have questions. COMMON BRAND NAME(S): Prozac What should I tell my health care provider before I take this medicine? They need to know if you have any of these conditions: -bipolar disorder or a family history of bipolar disorder -bleeding disorders -glaucoma -heart disease -liver disease -low levels of sodium in the blood -seizures -suicidal thoughts, plans, or attempt; a previous suicide attempt by you or a family member -take MAOIs like Carbex, Eldepryl, Marplan, Nardil, and Parnate -take medicines that treat or prevent blood clots -thyroid disease -an unusual or allergic reaction to fluoxetine,  other medicines, foods, dyes, or preservatives -pregnant or trying to get pregnant -breast-feeding How should I use this medicine? Take this medicine by mouth with a glass of water. Follow the directions on the  prescription label. You can take this medicine with or without food. Take your medicine at regular intervals. Do not take it more often than directed. Do not stop taking this medicine suddenly except upon the advice of your doctor. Stopping this medicine too quickly may cause serious side effects or your condition may worsen. A special MedGuide will be given to you by the pharmacist with each prescription and refill. Be sure to read this information carefully each time. Talk to your pediatrician regarding the use of this medicine in children. While this drug may be prescribed for children as young as 7 years for selected conditions, precautions do apply. Overdosage: If you think you have taken too much of this medicine contact a poison control center or emergency room at once. NOTE: This medicine is only for you. Do not share this medicine with others. What if I miss a dose? If you miss a dose, skip the missed dose and go back to your regular dosing schedule. Do not take double or extra doses. What may interact with this medicine? Do not take this medicine with any of the following medications: -other medicines containing fluoxetine, like Sarafem or Symbyax -cisapride -linezolid -MAOIs like Carbex, Eldepryl, Marplan, Nardil, and Parnate -methylene blue (injected into a vein) -pimozide -thioridazine This medicine may also interact with the following medications: -alcohol -amphetamines -aspirin and aspirin-like medicines -carbamazepine -certain medicines for depression, anxiety, or psychotic disturbances -certain medicines for migraine headaches like almotriptan, eletriptan, frovatriptan, naratriptan, rizatriptan, sumatriptan, zolmitriptan -digoxin -diuretics -fentanyl -flecainide -furazolidone -isoniazid -lithium -medicines for sleep -medicines that treat or prevent blood clots like warfarin, enoxaparin, and dalteparin -NSAIDs, medicines for pain and inflammation, like ibuprofen or  naproxen -phenytoin -procarbazine -propafenone -rasagiline -ritonavir -supplements like St. John's wort, kava kava, valerian -tramadol -tryptophan -vinblastine This list may not describe all possible interactions. Give your health care provider a list of all the medicines, herbs, non-prescription drugs, or dietary supplements you use. Also tell them if you smoke, drink alcohol, or use illegal drugs. Some items may interact with your medicine. What should I watch for while using this medicine? Tell your doctor if your symptoms do not get better or if they get worse. Visit your doctor or health care professional for regular checks on your progress. Because it may take several weeks to see the full effects of this medicine, it is important to continue your treatment as prescribed by your doctor. Patients and their families should watch out for new or worsening thoughts of suicide or depression. Also watch out for sudden changes in feelings such as feeling anxious, agitated, panicky, irritable, hostile, aggressive, impulsive, severely restless, overly excited and hyperactive, or not being able to sleep. If this happens, especially at the beginning of treatment or after a change in dose, call your health care professional. Bonita Quin may get drowsy or dizzy. Do not drive, use machinery, or do anything that needs mental alertness until you know how this medicine affects you. Do not stand or sit up quickly, especially if you are an older patient. This reduces the risk of dizzy or fainting spells. Alcohol may interfere with the effect of this medicine. Avoid alcoholic drinks. Your mouth may get dry. Chewing sugarless gum or sucking hard candy, and drinking plenty of water may help. Contact your doctor if the  problem does not go away or is severe. This medicine may affect blood sugar levels. If you have diabetes, check with your doctor or health care professional before you change your diet or the dose of your  diabetic medicine. What side effects may I notice from receiving this medicine? Side effects that you should report to your doctor or health care professional as soon as possible: -allergic reactions like skin rash, itching or hives, swelling of the face, lips, or tongue -anxious -black, tarry stools -breathing problems -changes in vision -confusion -elevated mood, decreased need for sleep, racing thoughts, impulsive behavior -eye pain -fast, irregular heartbeat -feeling faint or lightheaded, falls -feeling agitated, angry, or irritable -hallucination, loss of contact with reality -loss of balance or coordination -loss of memory -painful or prolonged erections -restlessness, pacing, inability to keep still -seizures -stiff muscles -suicidal thoughts or other mood changes -trouble sleeping -unusual bleeding or bruising -unusually weak or tired -vomiting Side effects that usually do not require medical attention (report to your doctor or health care professional if they continue or are bothersome): -change in appetite or weight -change in sex drive or performance -diarrhea -dry mouth -headache -increased sweating -nausea -tremors This list may not describe all possible side effects. Call your doctor for medical advice about side effects. You may report side effects to FDA at 1-800-FDA-1088. Where should I keep my medicine? Keep out of the reach of children. Store at room temperature between 15 and 30 degrees C (59 and 86 degrees F). Throw away any unused medicine after the expiration date. NOTE: This sheet is a summary. It may not cover all possible information. If you have questions about this medicine, talk to your doctor, pharmacist, or health care provider.  2018 Elsevier/Gold Standard (2015-06-21 15:55:27)
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
## 2017-12-27 ENCOUNTER — Ambulatory Visit: Payer: 59 | Admitting: Family Medicine

## 2017-12-27 ENCOUNTER — Encounter: Payer: Self-pay | Admitting: Family Medicine

## 2017-12-27 VITALS — BP 120/70 | Ht 62.0 in | Wt 215.0 lb

## 2017-12-27 DIAGNOSIS — F339 Major depressive disorder, recurrent, unspecified: Secondary | ICD-10-CM | POA: Insufficient documentation

## 2017-12-27 DIAGNOSIS — F5102 Adjustment insomnia: Secondary | ICD-10-CM | POA: Diagnosis not present

## 2017-12-27 MED ORDER — TRAZODONE HCL 100 MG PO TABS
100.0000 mg | ORAL_TABLET | Freq: Every evening | ORAL | 1 refills | Status: AC | PRN
Start: 2017-12-27 — End: ?

## 2017-12-27 MED ORDER — FLUOXETINE HCL 10 MG PO CAPS: 30.0000 mg | ORAL_CAPSULE | Freq: Every day | ORAL | 1 refills | Status: AC

## 2017-12-27 NOTE — Progress Notes (Signed)
Established Patient Office Visit  Subjective:  Patient ID: Pam Campbell, female    DOB: Feb 23, 1990  Age: 27 y.o. MRN: [id number]  CC: No chief complaint on file.   HPI [facility] presents for follow up on her depression. Her father has recently passed away.  Patient's father passed several days ago and his funeral rolled service was held at the funeral home this past Saturday.  Patient was responsible for making all of the arrangements.  Patient's father lived in [place] and worked at the [employer].  He had suffered from drug abuse and alcoholism for several years.  Patient is not up to going to work.  She would like to be out of work through next month.  I advised her that it might actually be good for her to go to work but she said that she will remain busy dealing with her father's estate over the next month or so.  Patient's father's death was unexpected and sudden.  It sounds as though he passed quickly from a massive MI.  Trazodone is not helping her sleep at its current dose.  Patient has had no thoughts of hurting herself but has felt that she would sometimes like to be with her father.  Past Medical History:  Diagnosis Date  . Hypertension     History reviewed. No pertinent surgical history.  History reviewed. No pertinent family history.  Social History   Socioeconomic History  . Marital status: Single    Spouse name: Not on file  . Number of children: Not on file  . Years of education: Not on file  . Highest education level: Not on file  Occupational History  . Not on file  Social Needs  . Financial resource strain: Not on file  . Food insecurity:    Worry: Not on file    Inability: Not on file  . Transportation needs:    Medical: Not on file    Non-medical: Not on file  Tobacco Use  . Smoking status: Current Every Day Smoker    Packs/day: 0.25    Types: Cigarettes  . Smokeless tobacco: Never Used  Substance and Sexual Activity  .  Alcohol use: Yes    Comment: occasional  . Drug use: No  . Sexual activity: Yes  Lifestyle  . Physical activity:    Days per week: Not on file    Minutes per session: Not on file  . Stress: Not on file  Relationships  . Social connections:    Talks on phone: Not on file    Gets together: Not on file    Attends religious service: Not on file    Active member of club or organization: Not on file    Attends meetings of clubs or organizations: Not on file    Relationship status: Not on file  . Intimate partner violence:    Fear of current or ex partner: Not on file    Emotionally abused: Not on file    Physically abused: Not on file    Forced sexual activity: Not on file  Other Topics Concern  . Not on file  Social History Narrative  . Not on file    Outpatient Medications Prior to Visit  Medication Sig Dispense Refill  . FLUoxetine (PROZAC) 10 MG tablet Take one each each morning for one week and then take 2 each morning. 60 tablet 1  . traZODone (DESYREL) 50 MG tablet Take 0.5-1 tablets (25-50 mg total) by mouth  at bedtime as needed for sleep. 30 tablet 1   No facility-administered medications prior to visit.     No Known Allergies  ROS Review of Systems  Constitutional: Negative.   Respiratory: Negative.   Cardiovascular: Negative.   Gastrointestinal: Negative.   Psychiatric/Behavioral: Positive for decreased concentration, dysphoric mood and sleep disturbance. Negative for self-injury and suicidal ideas. The patient is nervous/anxious.        Depression screen [facility] 2/9 12/27/2017 11/30/2017  Decreased Interest 3 3  Down, Depressed, Hopeless 3 3  PHQ - 2 Score 6 6  Altered sleeping 3 3  Tired, decreased energy 3 3  Change in appetite 2 3  Feeling bad or failure about yourself  3 3  Trouble concentrating 3 3  Moving slowly or fidgety/restless 3 2  Suicidal thoughts 1 0  PHQ-9 Score 24 23    Objective:    Physical Exam  BP 120/70   Ht '5\' 2"'  (1.575 m)    Wt 215 lb (97.5 kg)   BMI 39.32 kg/m  Wt Readings from Last 3 Encounters:  12/27/17 215 lb (97.5 kg)  11/30/17 215 lb 2 oz (97.6 kg)   BP Readings from Last 3 Encounters:  12/27/17 120/70  11/30/17 (!) 138/98  12/17/16 132/78   Health Maintenance Due  Topic Date Due  . TETANUS/TDAP  03/06/2009  . PAP SMEAR  03/07/2011  . INFLUENZA VACCINE  09/01/2017    There are no preventive care reminders to display for this patient.  No results found for: TSH No results found for: WBC, HGB, HCT, MCV, PLT No results found for: NA, K, CHLORIDE, CO2, GLUCOSE, BUN, CREATININE, BILITOT, ALKPHOS, AST, ALT, PROT, ALBUMIN, CALCIUM, ANIONGAP, EGFR, GFR No results found for: CHOL No results found for: HDL No results found for: LDLCALC No results found for: TRIG No results found for: CHOLHDL No results found for: HGBA1C    Assessment & Plan:   Problem List Items Addressed This Visit      Other   Depression, recurrent ([place]) - Primary   Relevant Medications   traZODone (DESYREL) 100 MG tablet   FLUoxetine (PROZAC) 10 MG capsule   Other Relevant Orders   Ambulatory referral to Psychology    Other Visit Diagnoses    Adjustment insomnia       Relevant Medications   traZODone (DESYREL) 100 MG tablet      Meds ordered this encounter  Medications  . traZODone (DESYREL) 100 MG tablet    Sig: Take 1 tablet (100 mg total) by mouth at bedtime as needed for sleep.    Dispense:  30 tablet    Refill:  1  . FLUoxetine (PROZAC) 10 MG capsule    Sig: Take 3 capsules (30 mg total) by mouth daily.    Dispense:  90 capsule    Refill:  1    Follow-up: Return in about 1 month (around 01/26/2018).   Will increase the Prozac to 30 mg daily and trazodone 200 mg at night.  Strongly encouraged the patient to follow-up for talking therapy with Bambi.  Will be completing paperwork for patient's short-term disability through next month.  Spent over 30 minutes with this patient with greater than 50% of the  time spent in counseling.

## 2018-01-10 ENCOUNTER — Ambulatory Visit: Payer: 59 | Admitting: Psychology

## 2018-01-10 DIAGNOSIS — F331 Major depressive disorder, recurrent, moderate: Secondary | ICD-10-CM

## 2018-01-18 ENCOUNTER — Ambulatory Visit: Payer: 59 | Admitting: Psychology

## 2018-01-18 DIAGNOSIS — F331 Major depressive disorder, recurrent, moderate: Secondary | ICD-10-CM | POA: Diagnosis not present

## 2018-01-27 ENCOUNTER — Ambulatory Visit: Payer: 59 | Admitting: Psychology

## 2018-01-30 ENCOUNTER — Ambulatory Visit: Payer: 59 | Admitting: Psychology

## 2018-01-30 DIAGNOSIS — F331 Major depressive disorder, recurrent, moderate: Secondary | ICD-10-CM

## 2018-01-31 ENCOUNTER — Telehealth: Payer: Self-pay | Admitting: Family Medicine

## 2018-01-31 NOTE — Telephone Encounter (Signed)
Okay for note for today?

## 2018-01-31 NOTE — Telephone Encounter (Signed)
Thanks

## 2018-01-31 NOTE — Telephone Encounter (Signed)
Note written & faxed to patient's FMLA. Patient is aware.

## 2018-01-31 NOTE — Telephone Encounter (Signed)
Copied from CRM [id number]. Topic: Quick Communication - See Telephone Encounter >> Jan 31, 2018 11:50 AM Baldo DaubAlexander, Amber L wrote: CRM for notification. See Telephone encounter for: 01/31/18.  Pt got confused about when she was supposed to return to work and wants to know if Dr. Doreene BurkeKremer will write her out of work for today. Pt can be reached at [phone]

## 2018-02-17 ENCOUNTER — Ambulatory Visit: Payer: Self-pay | Admitting: Psychology

## 2018-06-05 ENCOUNTER — Encounter: Payer: Self-pay | Admitting: Family Medicine

## 2018-06-05 NOTE — Progress Notes (Deleted)
Virtual Visit via Video Note  I connected with Pam Campbell on 06/05/18 at  2:00 PM EDT by a video enabled telemedicine application and verified that I am speaking with the correct person using two identifiers.  Location: Patient: *** Provider: ***   I discussed the limitations of evaluation and management by telemedicine and the availability of in person appointments. The patient expressed understanding and agreed to proceed.  History of Present Illness:    Observations/Objective:   Assessment and Plan:   Follow Up Instructions:    I discussed the assessment and treatment plan with the patient. The patient was provided an opportunity to ask questions and all were answered. The patient agreed with the plan and demonstrated an understanding of the instructions.   The patient was advised to call back or seek an in-person evaluation if the symptoms worsen or if the condition fails to improve as anticipated.  I provided *** minutes of non-face-to-face time during this encounter.

## 2018-07-14 DIAGNOSIS — N946 Dysmenorrhea, unspecified: Secondary | ICD-10-CM | POA: Diagnosis not present

## 2018-07-14 DIAGNOSIS — N920 Excessive and frequent menstruation with regular cycle: Secondary | ICD-10-CM | POA: Diagnosis not present

## 2018-07-14 DIAGNOSIS — Z6839 Body mass index (BMI) 39.0-39.9, adult: Secondary | ICD-10-CM | POA: Diagnosis not present

## 2018-07-14 DIAGNOSIS — N898 Other specified noninflammatory disorders of vagina: Secondary | ICD-10-CM | POA: Diagnosis not present

## 2018-07-14 DIAGNOSIS — Z01419 Encounter for gynecological examination (general) (routine) without abnormal findings: Secondary | ICD-10-CM | POA: Diagnosis not present

## 2018-07-21 DIAGNOSIS — Z130 Encounter for screening for diseases of the blood and blood-forming organs and certain disorders involving the immune mechanism: Secondary | ICD-10-CM | POA: Diagnosis not present

## 2018-09-06 DIAGNOSIS — N760 Acute vaginitis: Secondary | ICD-10-CM | POA: Diagnosis not present

## 2018-12-05 DIAGNOSIS — Z7251 High risk heterosexual behavior: Secondary | ICD-10-CM | POA: Diagnosis not present

## 2018-12-08 NOTE — Progress Notes (Signed)
This encounter was created in error - please disregard.

## 2019-01-01 DIAGNOSIS — Z0000 Encounter for general adult medical examination without abnormal findings: Secondary | ICD-10-CM | POA: Diagnosis not present

## 2019-01-01 DIAGNOSIS — Z131 Encounter for screening for diabetes mellitus: Secondary | ICD-10-CM | POA: Diagnosis not present

## 2019-01-01 DIAGNOSIS — Z1159 Encounter for screening for other viral diseases: Secondary | ICD-10-CM | POA: Diagnosis not present
